# Patient Record
Sex: Female | Born: 1955 | Race: Black or African American | Hispanic: No | Marital: Married | Smoking: Never smoker
Health system: Southern US, Community
[De-identification: ages and names within clinical notes are randomized; demographics above are authoritative.]

---

## 1999-07-18 ENCOUNTER — Other Ambulatory Visit: Admission: RE | Admit: 1999-07-18 | Discharge: 1999-07-18 | Payer: Self-pay | Admitting: Family Medicine

## 2001-01-04 ENCOUNTER — Ambulatory Visit (HOSPITAL_COMMUNITY): Admission: RE | Admit: 2001-01-04 | Discharge: 2001-01-04 | Payer: Self-pay | Admitting: *Deleted

## 2001-07-16 ENCOUNTER — Other Ambulatory Visit: Admission: RE | Admit: 2001-07-16 | Discharge: 2001-07-16 | Payer: Self-pay | Admitting: Family Medicine

## 2003-01-24 ENCOUNTER — Encounter: Payer: Self-pay | Admitting: Emergency Medicine

## 2003-01-24 ENCOUNTER — Inpatient Hospital Stay (HOSPITAL_COMMUNITY): Admission: EM | Admit: 2003-01-24 | Discharge: 2003-01-27 | Payer: Self-pay | Admitting: Emergency Medicine

## 2010-08-19 ENCOUNTER — Observation Stay (HOSPITAL_COMMUNITY): Admission: EM | Admit: 2010-08-19 | Discharge: 2010-08-19 | Payer: Self-pay | Admitting: Emergency Medicine

## 2010-08-21 ENCOUNTER — Emergency Department (HOSPITAL_COMMUNITY): Admission: EM | Admit: 2010-08-21 | Discharge: 2010-08-21 | Payer: Self-pay | Admitting: Emergency Medicine

## 2011-03-13 LAB — COMPREHENSIVE METABOLIC PANEL
Albumin: 3.8 g/dL (ref 3.5–5.2)
BUN: 5 mg/dL — ABNORMAL LOW (ref 6–23)
Creatinine, Ser: 0.87 mg/dL (ref 0.4–1.2)
Total Protein: 7.1 g/dL (ref 6.0–8.3)

## 2011-03-13 LAB — URINE MICROSCOPIC-ADD ON

## 2011-03-13 LAB — CULTURE, BLOOD (ROUTINE X 2)

## 2011-03-13 LAB — URINALYSIS, ROUTINE W REFLEX MICROSCOPIC
Bilirubin Urine: NEGATIVE
Glucose, UA: NEGATIVE mg/dL
Ketones, ur: NEGATIVE mg/dL
Nitrite: POSITIVE — AB
Protein, ur: NEGATIVE mg/dL
Specific Gravity, Urine: 1.003 — ABNORMAL LOW (ref 1.005–1.030)
Urobilinogen, UA: 0.2 mg/dL (ref 0.0–1.0)
pH: 7 (ref 5.0–8.0)

## 2011-03-13 LAB — URINE CULTURE
Colony Count: >=100000
Culture  Setup Time: 201108220514

## 2011-03-13 LAB — DIFFERENTIAL
Basophils Absolute: 0 10*3/uL (ref 0.0–0.1)
Lymphocytes Relative: 14 % (ref 12–46)
Monocytes Absolute: 0.1 10*3/uL (ref 0.1–1.0)
Monocytes Relative: 2 % — ABNORMAL LOW (ref 3–12)
Neutro Abs: 7 10*3/uL (ref 1.7–7.7)

## 2011-03-13 LAB — CBC
MCH: 30.6 pg (ref 26.0–34.0)
MCHC: 33.9 g/dL (ref 30.0–36.0)
MCV: 90.1 fL (ref 78.0–100.0)
Platelets: 231 10*3/uL (ref 150–400)
RDW: 13.1 % (ref 11.5–15.5)

## 2011-03-13 LAB — PROCALCITONIN: Procalcitonin: <0.5 ng/mL

## 2011-05-01 ENCOUNTER — Other Ambulatory Visit: Payer: Self-pay | Admitting: Family Medicine

## 2011-05-01 DIAGNOSIS — M545 Low back pain: Secondary | ICD-10-CM

## 2011-05-05 ENCOUNTER — Ambulatory Visit
Admission: RE | Admit: 2011-05-05 | Discharge: 2011-05-05 | Disposition: A | Discharge status: Home / Self Care | Payer: Commercial Managed Care - PPO | Source: Ambulatory Visit | Attending: Family Medicine | Admitting: Family Medicine

## 2011-05-05 DIAGNOSIS — M545 Low back pain: Secondary | ICD-10-CM

## 2011-05-16 NOTE — Discharge Summary (Signed)
Kelsey Clay, Kelsey Clay                             ACCOUNT NO.:  [id number]   MEDICAL RECORD NO.:  [id number]                   PATIENT TYPE:  INP   LOCATION:  5012                                 FACILITY:  [facility]   PHYSICIAN:  Ollen Gross, M.D.                 DATE OF BIRTH:  1956-03-22   DATE OF ADMISSION:  01/24/2003  DATE OF DISCHARGE:  01/27/2003                                 DISCHARGE SUMMARY   ADMISSION DIAGNOSIS:  Bimalleolar, right ankle fracture with subluxation of  talus.   DISCHARGE DIAGNOSIS:  Right ankle bimalleolar fracture, status post open  reduction internal fixation of right bimalleolar ankle fracture.   PROCEDURE:  The patient was taken to the operating room on January 24, 2003,  and underwent open reduction internal fixation of right ankle, surgeon Dr.  Lequita Halt, assistant Alexzandrew L. Julien Girt, P.A.  Surgery under general  anesthesia.  Minimal blood loss.  Tourniquet time 46 minutes at 350 mmHg.   CONSULTATIONS:  None.   HISTORY OF PRESENT ILLNESS:  The patient is a 55 year old female who fell  last night, the evening before surgery, slipping on the ice.  The patient  had immediate right ankle pain.  The pain had worsened through the morning.  She came into the ER for evaluation.  The patient was seen and found to have  a bimalleolar ankle fracture.  Dr. Lequita Halt was on call.  The patient was  seen and admitted for surgical intervention.   LABORATORY DATA AND X-RAY FINDINGS:  CBC on admission with hemoglobin 13.3,  hematocrit 38.4, white cell count 7.3, red cell count 4.44, differential  within normal limits.  PT/INR on admission was 14.8 and 1.1.  Serial  protimes per protocol.  Last noted PT/INR 16.1 and 1.3.  BMET on admission  within normal limits.   Ankle films on January 24, 2003, with bimalleolar ankle fracture.  EKG dated  January 24, 2003, with normal sinus rhythm, T wave abnormalities.  Consider  inferior ischemia and abnormal EKG.  This is  unconfirmed.   HOSPITAL COURSE:  The patient was admitted to [facility] and taken  to the operating room.  She underwent the above-stated procedure without  complication.  The patient tolerated the procedure well and was later moved  to the recovery room and then to the orthopedic floor for continued postop  care.  The patient was placed on PC analgesic for pain control following  surgery and given p.o. medications.  She was started on Coumadin for DVT  prophylaxis.  PT consult for strict nonweightbearing.  She was given 24  hours of postop IV antibiotics.  The patient started getting physical  therapy and encouraged ice and elevation of the leg when she was not up with  PT.  She did have a little bit of nausea on the evening of postop day #1 and  this was  getting better.  She was initially weaned off the PC analgesics on  postop day #2 over to p.o. medications.  By postop day #3, she was feeling  much better.  She started improving with physical therapy.  Discharge  planning arranged for equipment to be done at home with home health PT and  nursing for control of the Coumadin.  She is doing well and discharged to  home.   DISPOSITION:  The patient was discharged home on January 27, 2003.   DISCHARGE MEDICATIONS:  1. Percocet for pain.  2. Robaxin for spasm.  3. Coumadin for DVT prophylaxis.   DIET:  As tolerated.   ACTIVITY:  Strict nonweightbearing on right lower extremity.  Ambulation  with PT and home health nursing.  Coumadin for DVT prophylaxis.   FOLLOW UP:  Follow up one week from discharge.   CONDITION ON DISCHARGE:  Improved.     Alexzandrew L. Julien Girt, P.A.              Ollen Gross, M.D.    ALP/MEDQ  D:  03/04/2003  T:  03/06/2003  Job:  [id number]

## 2011-05-16 NOTE — Op Note (Signed)
Kelsey Clay, Kelsey Clay                             ACCOUNT NO.:  [id number]   MEDICAL RECORD NO.:  [id number]                   PATIENT TYPE:  INP   LOCATION:  1825                                 FACILITY:  [facility]   PHYSICIAN:  Ollen Gross, M.D.                 DATE OF BIRTH:  [birth year]   DATE OF PROCEDURE:  01/24/2003  DATE OF DISCHARGE:                                 OPERATIVE REPORT   PREOPERATIVE DIAGNOSIS:  Bimalleolar right ankle fracture.   POSTOPERATIVE DIAGNOSIS:  Bimalleolar right ankle fracture.   PROCEDURE:  Open reduction and internal fixation right bimalleolar ankle  fracture.   SURGEON:  Ollen Gross, M.D.   ASSISTANT:  Alexzandrew L. Julien Girt, P.A.   ANESTHESIA:  General.   ESTIMATED BLOOD LOSS:  Minimal.  Drains; none.   COMPLICATIONS:  None.   TOURNIQUET TIME:  46 minutes at 350 mmHg.   CONDITION ON TRANSFER:  Stable to recovery room.   INDICATIONS FOR PROCEDURE:  The patient is a 55 year old female who had a  fall on the ice last night sustaining a displaced bimalleolar ankle fracture  with lateral subluxation of the talus.  She presents now for open reduction  and internal fixation.   DESCRIPTION OF PROCEDURE:  After successful administration of general  anesthetic, the tourniquet was placed high on the right thigh. The right  lower extremity was prepped and draped in the usual sterile fashion.  The  extremities were wrapped with Esmarch, the tourniquet inflated to 350 mmHg.  Incision was made over the lateral malleolus with a 10 blade through the  subcutaneous tissue to the level of the periosteum.  The fracture was  identified, anatomic reduction performed, and then a six-hole 1/3 tubular  locking plate is placed and contoured along the lateral malleolus.  We  filled the proximal three holes with cortical screws and the distal two  holes with locking screws.  This looked excellent anatomic fixation.  The  other hole was left open as it was at the  level of the fracture line. We  took AP, lateral and mortis x-rays showing anatomic reduction. The medial  malleolar fragment had reduced nicely with this. The incision is then made  vertically over the medial malleolus at the tip and the fracture line  identified.  We reduced it, held it with a clamp, and then took two 2.9 mm  drill bits parallel to each other across the tip of the malleolus into the  proximal portion.  We took two 50 mm cancellous screws and placed them  through the drill holes to get an excellent reduction of the medial  malleolus.  There was some slight comminution completely anterior, but the  body of the fracture was anatomically reduced.  The wound was then copiously  irrigated medial and lateral. Multiple other x-rays were taken showing  anatomic reduction. The wound was then copiously  irrigated again  and the subcutaneous tissues were closed with interrupted 2-0 Vicryl and  skin with interrupted 4-0 nylon.  Tourniquet was released with a total time  of 46 minutes.  Bulky sterile dressing and posterior splints were applied.  She was then awakened and transported to the recovery room in stable  condition.                                               Ollen Gross, M.D.    FA/MEDQ  D:  01/24/2003  T:  01/24/2003  Job:  [id number]

## 2011-07-03 ENCOUNTER — Other Ambulatory Visit (HOSPITAL_COMMUNITY)
Admission: RE | Admit: 2011-07-03 | Discharge: 2011-07-03 | Disposition: A | Discharge status: Home / Self Care | Payer: Commercial Managed Care - PPO | Source: Ambulatory Visit | Attending: Family Medicine | Admitting: Family Medicine

## 2011-07-03 ENCOUNTER — Other Ambulatory Visit: Payer: Self-pay | Admitting: Family Medicine

## 2011-07-03 DIAGNOSIS — Z01419 Encounter for gynecological examination (general) (routine) without abnormal findings: Secondary | ICD-10-CM | POA: Insufficient documentation

## 2011-11-03 ENCOUNTER — Other Ambulatory Visit: Payer: Self-pay | Admitting: Gastroenterology

## 2011-12-16 ENCOUNTER — Other Ambulatory Visit: Payer: Self-pay | Admitting: Family Medicine

## 2011-12-16 DIAGNOSIS — D3500 Benign neoplasm of unspecified adrenal gland: Secondary | ICD-10-CM

## 2011-12-18 ENCOUNTER — Ambulatory Visit
Admission: RE | Admit: 2011-12-18 | Discharge: 2011-12-18 | Disposition: A | Discharge status: Home / Self Care | Payer: Commercial Managed Care - PPO | Source: Ambulatory Visit | Attending: Family Medicine | Admitting: Family Medicine

## 2011-12-18 ENCOUNTER — Other Ambulatory Visit: Payer: Self-pay | Admitting: Family Medicine

## 2011-12-18 DIAGNOSIS — D3500 Benign neoplasm of unspecified adrenal gland: Secondary | ICD-10-CM

## 2012-07-05 IMAGING — CT CT ABDOMEN W/O CM
1 of 4 series · 15 of 36 positions shown, 19 images · IV contrast (READICAT/WATER)
Comparison: CT abdomen with contrast 08/19/2010.

CLINICAL DATA: 55-year-old female with 1-year follow-up of left
adrenal lesion.  Occasional abdominal pain.

CT ABDOMEN WITHOUT CONTRAST
TECHNIQUE: Multidetector CT imaging of the abdomen was performed
following the standard protocol without IV contrast.

[Series 2: unenhanced · axial · 0.72mm/px · z∈[-220,+28]mm · 15 of 109 slices shown, 19 images]
[im 5/109  soft-tissue]
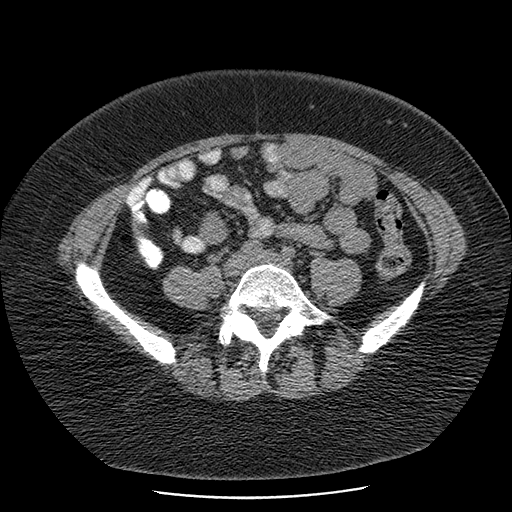
[im 5/109  bone]
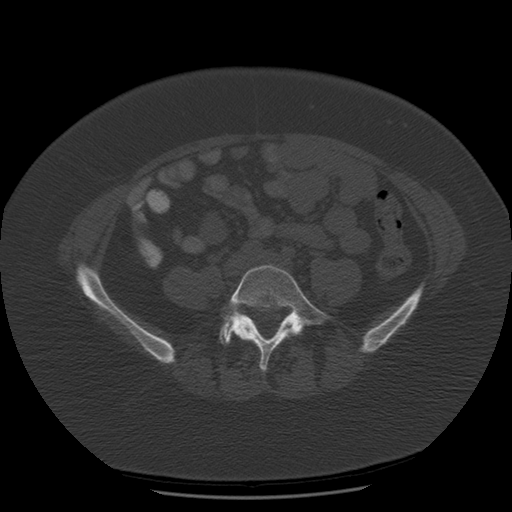
[im 13/109  soft-tissue]
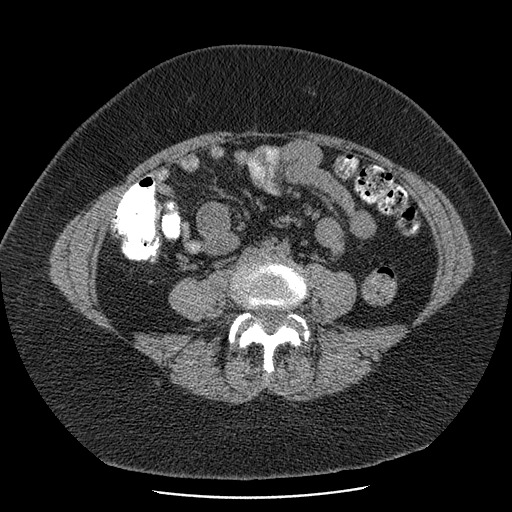
[im 22/109  soft-tissue]
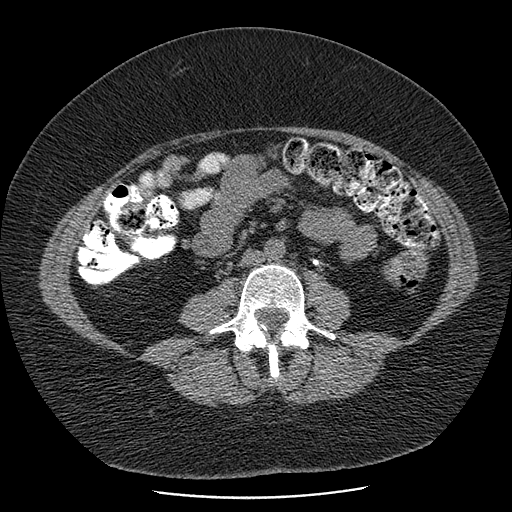
[im 31/109  soft-tissue]
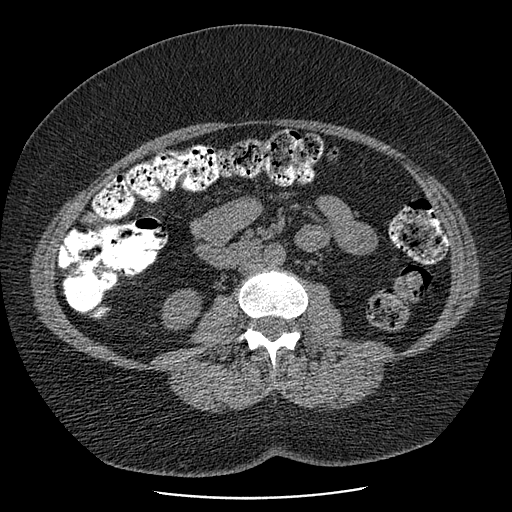
[im 39/109  soft-tissue]
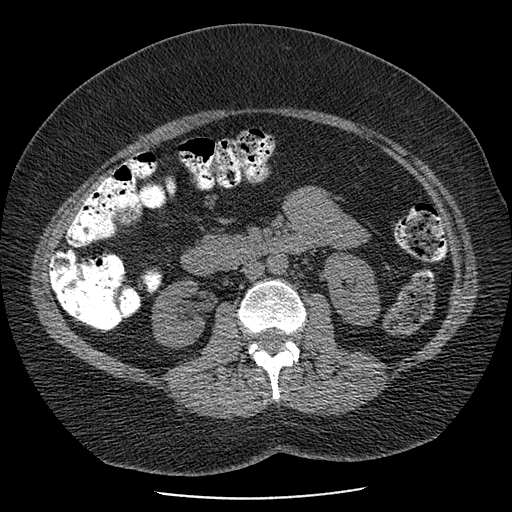
[im 48/109  soft-tissue]
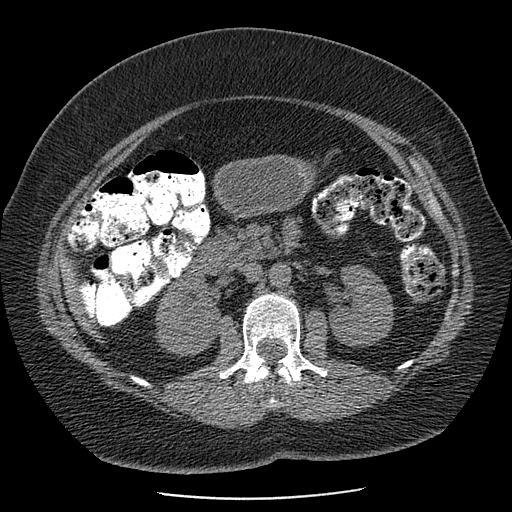
[im 57/109  soft-tissue]
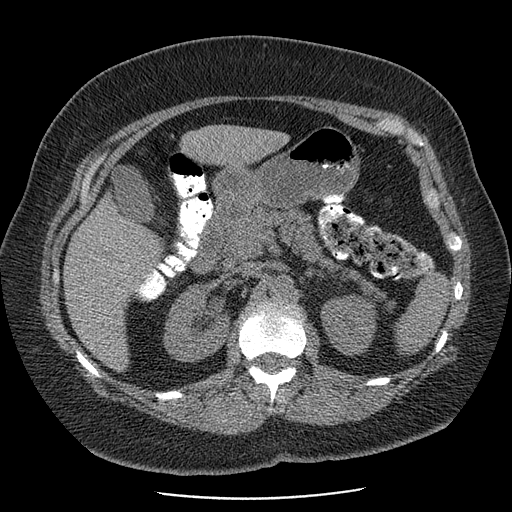
[im 61/109  soft-tissue]
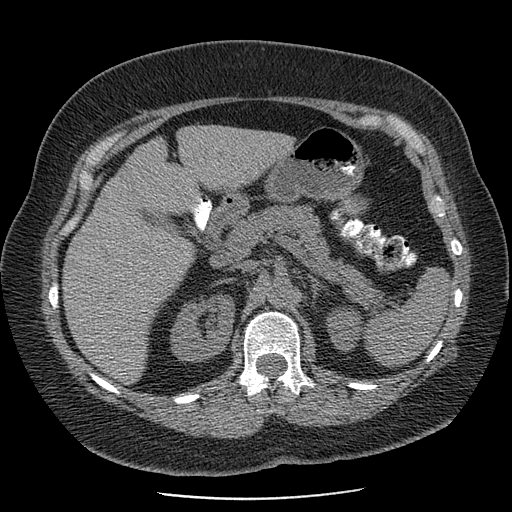
[im 70/109  soft-tissue]
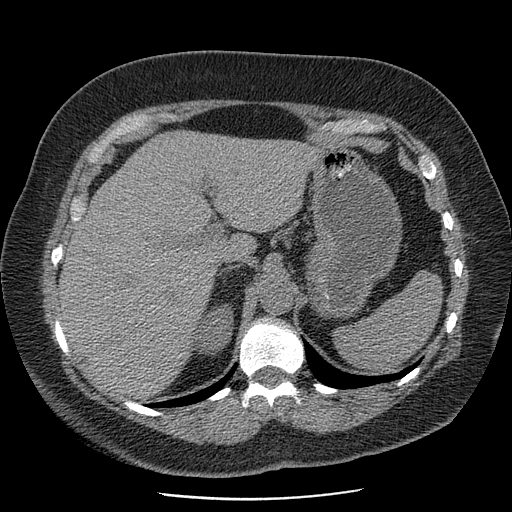
[im 70/109  bone]
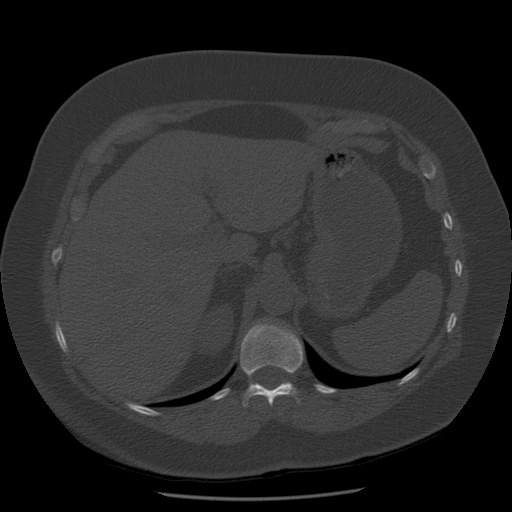
[im 78/109  soft-tissue]
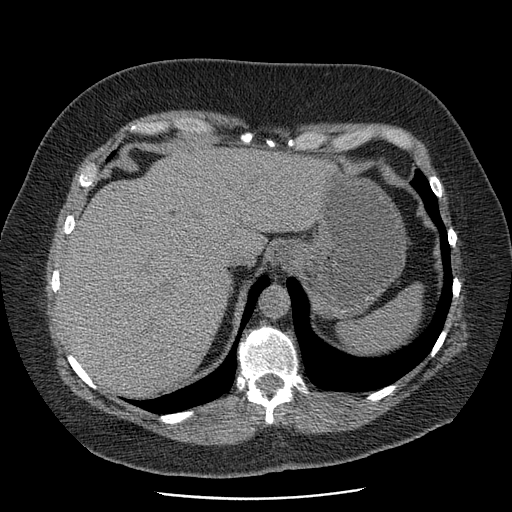
[im 87/109  soft-tissue]
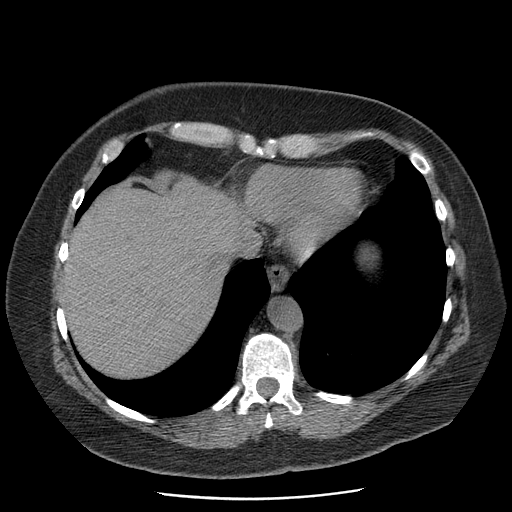
[im 91/109  lung]
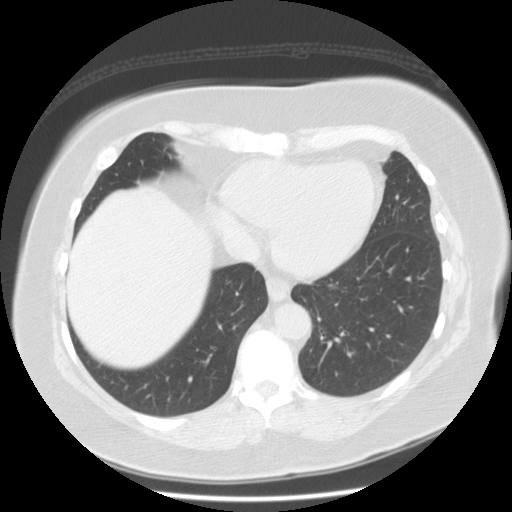
[im 96/109  soft-tissue]
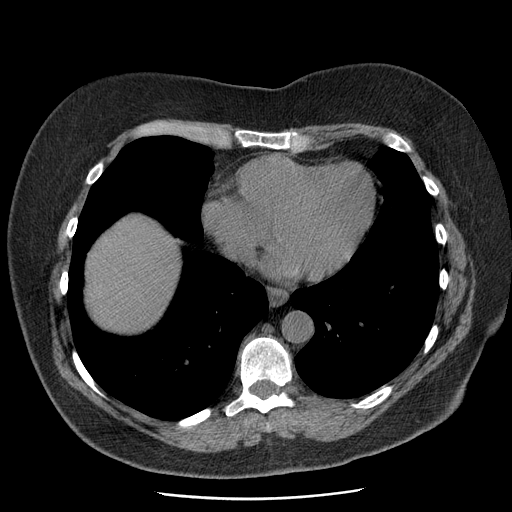
[im 96/109  lung]
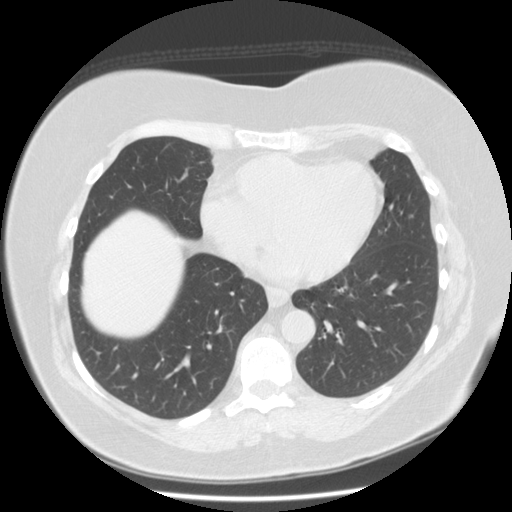
[im 100/109  lung]
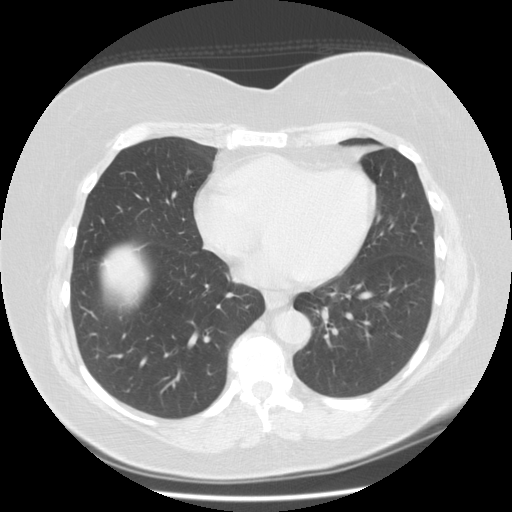
[im 104/109  soft-tissue]
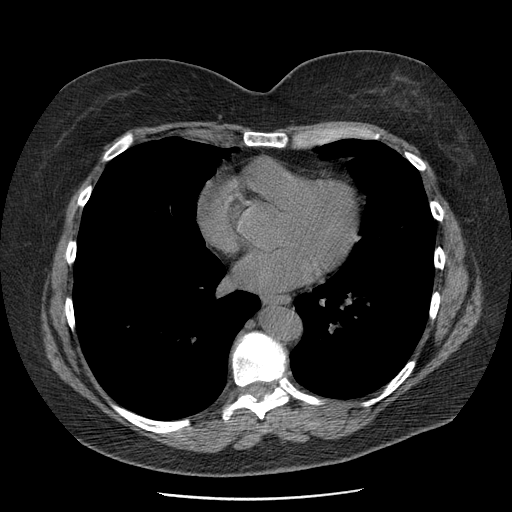
[im 104/109  lung]
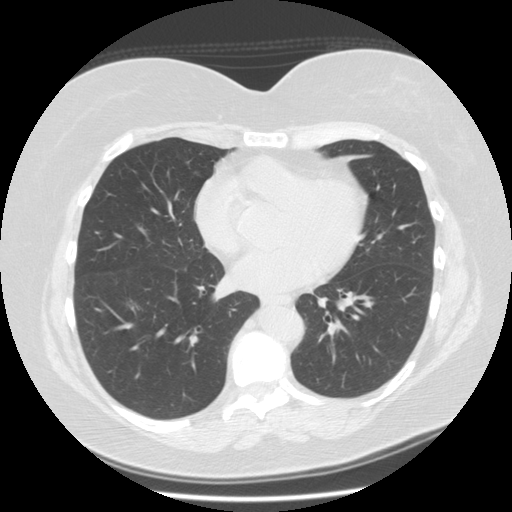

[15 of 36 positions shown; findings below may reference images not displayed]

FINDINGS: Negative lung bases.  Degenerative changes in the spine.
Incidental incomplete fusion of the lower thoracic posterior
elements.

Left adrenal nodule is not significantly changed in size measuring
15 x 15 mm.  Densitometry on the thin slice noncontrast study
averages -12 HU (with Hounsfield units as low as -50 to -60).  No
surrounding lymphadenopathy inflammatory change.

Negative noncontrast stomach, duodenum, and proximal small bowel.
There is oral contrast in the visualized colon mixed with stool.
There is a small volume of oral contrast in the visualized distal
small bowel.  No abdominal free fluid.  Negative noncontrast
spleen, pancreas, and right adrenal gland.  Stable low density area
along the right lateral liver measuring 9 x 13 mm.  There are
occasional sub centimeter low density liver lesions also visible
(central liver 5 mm series 2 image 33).  Otherwise negative
noncontrast liver parenchyma.  Negative noncontrast gallbladder.
Stable kidneys with occasional subtle/small cortical cysts. Partial
or complete duplication of the right renal collecting system re-
identified.
IMPRESSION: 1.  Benign left adrenal nodule is unchanged and is either a lipid
rich adenoma or myelolipoma. No additional follow-up is necessary.
2.  No acute findings in the abdomen.

## 2012-10-29 ENCOUNTER — Other Ambulatory Visit: Payer: Self-pay | Admitting: Neurosurgery

## 2012-10-29 DIAGNOSIS — Q675 Congenital deformity of spine: Secondary | ICD-10-CM

## 2012-10-29 DIAGNOSIS — M47817 Spondylosis without myelopathy or radiculopathy, lumbosacral region: Secondary | ICD-10-CM

## 2012-10-29 DIAGNOSIS — M4310 Spondylolisthesis, site unspecified: Secondary | ICD-10-CM

## 2012-10-29 DIAGNOSIS — M545 Low back pain: Secondary | ICD-10-CM

## 2012-11-09 ENCOUNTER — Ambulatory Visit
Admission: RE | Admit: 2012-11-09 | Discharge: 2012-11-09 | Disposition: A | Discharge status: Home / Self Care | Payer: Commercial Managed Care - PPO | Source: Ambulatory Visit | Attending: Neurosurgery | Admitting: Neurosurgery

## 2012-11-09 DIAGNOSIS — M4310 Spondylolisthesis, site unspecified: Secondary | ICD-10-CM

## 2012-11-09 DIAGNOSIS — Q675 Congenital deformity of spine: Secondary | ICD-10-CM

## 2012-11-09 DIAGNOSIS — M545 Low back pain: Secondary | ICD-10-CM

## 2012-11-09 DIAGNOSIS — M47817 Spondylosis without myelopathy or radiculopathy, lumbosacral region: Secondary | ICD-10-CM

## 2014-01-23 ENCOUNTER — Other Ambulatory Visit (HOSPITAL_COMMUNITY)
Admission: RE | Admit: 2014-01-23 | Discharge: 2014-01-23 | Disposition: A | Discharge status: Home / Self Care | Payer: Commercial Managed Care - PPO | Source: Ambulatory Visit | Attending: Family Medicine | Admitting: Family Medicine

## 2014-01-23 ENCOUNTER — Other Ambulatory Visit: Payer: Self-pay | Admitting: Family Medicine

## 2014-01-23 DIAGNOSIS — Z124 Encounter for screening for malignant neoplasm of cervix: Secondary | ICD-10-CM | POA: Insufficient documentation

## 2016-03-28 LAB — GLUCOSE, POCT (MANUAL RESULT ENTRY): POC Glucose: 93 mg/dl (ref 70–99)

## 2016-06-26 ENCOUNTER — Other Ambulatory Visit: Payer: Self-pay | Admitting: Family Medicine

## 2016-06-26 DIAGNOSIS — Z1231 Encounter for screening mammogram for malignant neoplasm of breast: Secondary | ICD-10-CM

## 2016-07-11 ENCOUNTER — Ambulatory Visit
Admission: RE | Admit: 2016-07-11 | Discharge: 2016-07-11 | Disposition: A | Discharge status: Home / Self Care | Payer: Medicare Other | Source: Ambulatory Visit | Attending: Family Medicine | Admitting: Family Medicine

## 2016-07-11 DIAGNOSIS — Z1231 Encounter for screening mammogram for malignant neoplasm of breast: Secondary | ICD-10-CM

## 2017-02-04 ENCOUNTER — Other Ambulatory Visit: Payer: Self-pay | Admitting: Family Medicine

## 2017-02-04 ENCOUNTER — Other Ambulatory Visit (HOSPITAL_COMMUNITY)
Admission: RE | Admit: 2017-02-04 | Discharge: 2017-02-04 | Disposition: A | Discharge status: Home / Self Care | Payer: Medicare Other | Source: Ambulatory Visit | Attending: Family Medicine | Admitting: Family Medicine

## 2017-02-04 DIAGNOSIS — Z124 Encounter for screening for malignant neoplasm of cervix: Secondary | ICD-10-CM | POA: Insufficient documentation

## 2017-02-06 LAB — CYTOLOGY - PAP: DIAGNOSIS: NEGATIVE

## 2017-04-09 ENCOUNTER — Ambulatory Visit
Admission: RE | Admit: 2017-04-09 | Discharge: 2017-04-09 | Disposition: A | Discharge status: Home / Self Care | Payer: Medicare Other | Source: Ambulatory Visit | Attending: Family Medicine | Admitting: Family Medicine

## 2017-04-09 ENCOUNTER — Other Ambulatory Visit: Payer: Self-pay | Admitting: Family Medicine

## 2017-04-09 DIAGNOSIS — M1990 Unspecified osteoarthritis, unspecified site: Secondary | ICD-10-CM

## 2017-11-10 ENCOUNTER — Other Ambulatory Visit: Payer: Self-pay | Admitting: Family Medicine

## 2017-11-10 DIAGNOSIS — Z1231 Encounter for screening mammogram for malignant neoplasm of breast: Secondary | ICD-10-CM

## 2017-11-17 ENCOUNTER — Ambulatory Visit
Admission: RE | Admit: 2017-11-17 | Discharge: 2017-11-17 | Disposition: A | Discharge status: Home / Self Care | Payer: Medicare Other | Source: Ambulatory Visit | Attending: Family Medicine | Admitting: Family Medicine

## 2017-11-17 DIAGNOSIS — Z1231 Encounter for screening mammogram for malignant neoplasm of breast: Secondary | ICD-10-CM

## 2018-10-28 ENCOUNTER — Other Ambulatory Visit: Payer: Self-pay | Admitting: Podiatry

## 2018-10-28 ENCOUNTER — Encounter: Payer: Self-pay | Admitting: Podiatry

## 2018-10-28 ENCOUNTER — Ambulatory Visit (INDEPENDENT_AMBULATORY_CARE_PROVIDER_SITE_OTHER): Payer: Medicare Other

## 2018-10-28 ENCOUNTER — Ambulatory Visit: Payer: Medicare Other | Admitting: Podiatry

## 2018-10-28 DIAGNOSIS — M778 Other enthesopathies, not elsewhere classified: Secondary | ICD-10-CM

## 2018-10-28 DIAGNOSIS — M2042 Other hammer toe(s) (acquired), left foot: Secondary | ICD-10-CM

## 2018-10-28 DIAGNOSIS — L84 Corns and callosities: Secondary | ICD-10-CM | POA: Diagnosis not present

## 2018-10-28 DIAGNOSIS — M2041 Other hammer toe(s) (acquired), right foot: Secondary | ICD-10-CM | POA: Diagnosis not present

## 2018-10-28 DIAGNOSIS — M21619 Bunion of unspecified foot: Secondary | ICD-10-CM | POA: Diagnosis not present

## 2018-10-28 DIAGNOSIS — M779 Enthesopathy, unspecified: Principal | ICD-10-CM

## 2018-10-28 NOTE — Progress Notes (Signed)
Subjective:   Patient ID: Kelsey Clay, female   DOB: 62 y.o.   MRN: [id number]   HPI Patient presents with significant hammertoe deformity second digit left foot with significant structural bunion deformity bilateral and digital deformities bilateral.  Keratotic lesion on top of the toe that is painful and she cannot cut it and patient does not smoke likes to be active is unable to wear shoe gear comfortably   Review of Systems  All other systems reviewed and are negative.       Objective:  Physical Exam  Constitutional: She appears well-developed and well-nourished.  Cardiovascular: Intact distal pulses.  Pulmonary/Chest: Effort normal.  Musculoskeletal: Normal range of motion.  Neurological: She is alert.  Skin: Skin is warm.  Nursing note and vitals reviewed.   Neurovascular status found to be intact muscle strength is adequate range of motion within normal limits with patient found to have rigid contracture digit to left with dorsal keratotic lesion with severe bunion deformity with a hallux coming underneath the second toe and lifting the second toe into the year.  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  At this time we will get a try sterile debridement of the lesion padding of the area along with wider shoes and consideration for structural correction of the foot if symptoms come back quickly.  Patient will be seen back as needed  X-ray indicates severe structural bunion deformity left over right with elevation of the lesser digits and rigid contracture

## 2018-11-30 ENCOUNTER — Other Ambulatory Visit: Payer: Self-pay | Admitting: Family Medicine

## 2018-11-30 DIAGNOSIS — Z1231 Encounter for screening mammogram for malignant neoplasm of breast: Secondary | ICD-10-CM

## 2019-01-06 ENCOUNTER — Ambulatory Visit
Admission: RE | Admit: 2019-01-06 | Discharge: 2019-01-06 | Disposition: A | Discharge status: Home / Self Care | Payer: Medicare Other | Source: Ambulatory Visit | Attending: Family Medicine | Admitting: Family Medicine

## 2019-01-06 DIAGNOSIS — Z1231 Encounter for screening mammogram for malignant neoplasm of breast: Secondary | ICD-10-CM

## 2019-10-04 ENCOUNTER — Other Ambulatory Visit: Payer: Self-pay

## 2019-10-04 DIAGNOSIS — Z20822 Contact with and (suspected) exposure to covid-19: Secondary | ICD-10-CM

## 2019-10-06 LAB — SPECIMEN STATUS REPORT

## 2019-10-06 LAB — NOVEL CORONAVIRUS, NAA: SARS-CoV-2, NAA: NOT DETECTED

## 2019-10-07 ENCOUNTER — Telehealth: Payer: Self-pay | Admitting: Family Medicine

## 2019-10-07 NOTE — Telephone Encounter (Signed)
Pt called to get COVID results.  Informed pt that they are negative.

## 2019-12-26 ENCOUNTER — Other Ambulatory Visit: Payer: Self-pay | Admitting: Family Medicine

## 2019-12-26 DIAGNOSIS — Z1231 Encounter for screening mammogram for malignant neoplasm of breast: Secondary | ICD-10-CM

## 2020-02-06 ENCOUNTER — Other Ambulatory Visit: Payer: Self-pay

## 2020-02-06 ENCOUNTER — Ambulatory Visit
Admission: RE | Admit: 2020-02-06 | Discharge: 2020-02-06 | Disposition: A | Discharge status: Home / Self Care | Payer: Medicare Other | Source: Ambulatory Visit | Attending: Family Medicine | Admitting: Family Medicine

## 2020-02-06 DIAGNOSIS — Z1231 Encounter for screening mammogram for malignant neoplasm of breast: Secondary | ICD-10-CM

## 2020-02-18 ENCOUNTER — Ambulatory Visit: Payer: Medicare Other | Attending: Internal Medicine

## 2020-02-18 ENCOUNTER — Ambulatory Visit: Payer: Medicare Other

## 2020-02-18 DIAGNOSIS — Z23 Encounter for immunization: Secondary | ICD-10-CM | POA: Insufficient documentation

## 2020-02-18 NOTE — Progress Notes (Signed)
   Covid-19 Vaccination Clinic  Name:  Kelsey Clay    MRN: [id number] DOB: [birth year]  02/18/2020  [name] was observed post Covid-19 immunization for 15 minutes without incidence. She was provided with Vaccine Information Sheet and instruction to access the V-Safe system.   [name] was instructed to call 911 with any severe reactions post vaccine: Marland Kitchen Difficulty breathing  . Swelling of your face and throat  . A fast heartbeat  . A bad rash all over your body  . Dizziness and weakness    Immunizations Administered    Name Date Dose VIS Date Route   Pfizer COVID-19 Vaccine 02/18/2020  3:37 PM 0.3 mL 12/09/2019 Intramuscular   Manufacturer: [place]   Lot: [id number]   [place]: [id number]

## 2020-03-13 ENCOUNTER — Ambulatory Visit: Payer: Medicare Other | Attending: Internal Medicine

## 2020-03-13 DIAGNOSIS — Z23 Encounter for immunization: Secondary | ICD-10-CM

## 2020-03-13 NOTE — Progress Notes (Signed)
   Covid-19 Vaccination Clinic  Name:  Kelsey Clay    MRN: [id number] DOB: 07-11-1956  03/13/2020  [name] was observed post Covid-19 immunization for 15 minutes without incident. She was provided with Vaccine Information Sheet and instruction to access the V-Safe system.   [name] was instructed to call 911 with any severe reactions post vaccine: Marland Kitchen Difficulty breathing  . Swelling of face and throat  . A fast heartbeat  . A bad rash all over body  . Dizziness and weakness   Immunizations Administered    Name Date Dose VIS Date Route   Pfizer COVID-19 Vaccine 03/13/2020  2:20 PM 0.3 mL 12/09/2019 Intramuscular   Manufacturer: [place]   Lot: [id number]   [place]: [id number]

## 2021-01-01 DIAGNOSIS — I129 Hypertensive chronic kidney disease with stage 1 through stage 4 chronic kidney disease, or unspecified chronic kidney disease: Secondary | ICD-10-CM | POA: Diagnosis not present

## 2021-01-01 DIAGNOSIS — M5137 Other intervertebral disc degeneration, lumbosacral region: Secondary | ICD-10-CM | POA: Diagnosis not present

## 2021-01-01 DIAGNOSIS — E7800 Pure hypercholesterolemia, unspecified: Secondary | ICD-10-CM | POA: Diagnosis not present

## 2021-01-01 DIAGNOSIS — Z0000 Encounter for general adult medical examination without abnormal findings: Secondary | ICD-10-CM | POA: Diagnosis not present

## 2021-01-01 DIAGNOSIS — K219 Gastro-esophageal reflux disease without esophagitis: Secondary | ICD-10-CM | POA: Diagnosis not present

## 2021-01-11 ENCOUNTER — Other Ambulatory Visit: Payer: Self-pay | Admitting: Family Medicine

## 2021-01-11 DIAGNOSIS — Z1231 Encounter for screening mammogram for malignant neoplasm of breast: Secondary | ICD-10-CM

## 2021-01-17 DIAGNOSIS — M1711 Unilateral primary osteoarthritis, right knee: Secondary | ICD-10-CM | POA: Diagnosis not present

## 2021-01-17 DIAGNOSIS — M25561 Pain in right knee: Secondary | ICD-10-CM | POA: Diagnosis not present

## 2021-02-18 ENCOUNTER — Ambulatory Visit
Admission: RE | Admit: 2021-02-18 | Discharge: 2021-02-18 | Disposition: A | Discharge status: Home / Self Care | Payer: Medicare Other | Source: Ambulatory Visit | Attending: Family Medicine | Admitting: Family Medicine

## 2021-02-18 ENCOUNTER — Other Ambulatory Visit: Payer: Self-pay

## 2021-02-18 DIAGNOSIS — Z1231 Encounter for screening mammogram for malignant neoplasm of breast: Secondary | ICD-10-CM | POA: Diagnosis not present

## 2021-05-21 DIAGNOSIS — M25561 Pain in right knee: Secondary | ICD-10-CM | POA: Diagnosis not present

## 2021-07-03 DIAGNOSIS — E7800 Pure hypercholesterolemia, unspecified: Secondary | ICD-10-CM | POA: Diagnosis not present

## 2021-07-03 DIAGNOSIS — N183 Chronic kidney disease, stage 3 unspecified: Secondary | ICD-10-CM | POA: Diagnosis not present

## 2021-07-03 DIAGNOSIS — I129 Hypertensive chronic kidney disease with stage 1 through stage 4 chronic kidney disease, or unspecified chronic kidney disease: Secondary | ICD-10-CM | POA: Diagnosis not present

## 2021-10-22 DIAGNOSIS — M1711 Unilateral primary osteoarthritis, right knee: Secondary | ICD-10-CM | POA: Diagnosis not present

## 2021-10-22 DIAGNOSIS — M25561 Pain in right knee: Secondary | ICD-10-CM | POA: Diagnosis not present

## 2022-01-09 ENCOUNTER — Other Ambulatory Visit: Payer: Self-pay | Admitting: Family Medicine

## 2022-01-09 DIAGNOSIS — D3500 Benign neoplasm of unspecified adrenal gland: Secondary | ICD-10-CM | POA: Diagnosis not present

## 2022-01-09 DIAGNOSIS — Z0000 Encounter for general adult medical examination without abnormal findings: Secondary | ICD-10-CM | POA: Diagnosis not present

## 2022-01-09 DIAGNOSIS — N183 Chronic kidney disease, stage 3 unspecified: Secondary | ICD-10-CM | POA: Diagnosis not present

## 2022-01-09 DIAGNOSIS — K219 Gastro-esophageal reflux disease without esophagitis: Secondary | ICD-10-CM | POA: Diagnosis not present

## 2022-01-09 DIAGNOSIS — Z13820 Encounter for screening for osteoporosis: Secondary | ICD-10-CM

## 2022-01-09 DIAGNOSIS — Z23 Encounter for immunization: Secondary | ICD-10-CM | POA: Diagnosis not present

## 2022-01-09 DIAGNOSIS — E7800 Pure hypercholesterolemia, unspecified: Secondary | ICD-10-CM | POA: Diagnosis not present

## 2022-01-09 DIAGNOSIS — M5137 Other intervertebral disc degeneration, lumbosacral region: Secondary | ICD-10-CM | POA: Diagnosis not present

## 2022-01-09 DIAGNOSIS — I129 Hypertensive chronic kidney disease with stage 1 through stage 4 chronic kidney disease, or unspecified chronic kidney disease: Secondary | ICD-10-CM | POA: Diagnosis not present

## 2022-01-09 DIAGNOSIS — Z1389 Encounter for screening for other disorder: Secondary | ICD-10-CM | POA: Diagnosis not present

## 2022-01-21 ENCOUNTER — Other Ambulatory Visit: Payer: Self-pay | Admitting: Family Medicine

## 2022-01-21 DIAGNOSIS — Z1231 Encounter for screening mammogram for malignant neoplasm of breast: Secondary | ICD-10-CM

## 2022-01-30 DIAGNOSIS — M1711 Unilateral primary osteoarthritis, right knee: Secondary | ICD-10-CM | POA: Diagnosis not present

## 2022-02-19 ENCOUNTER — Ambulatory Visit
Admission: RE | Admit: 2022-02-19 | Discharge: 2022-02-19 | Disposition: A | Discharge status: Home / Self Care | Payer: Medicare Other | Source: Ambulatory Visit | Attending: Family Medicine | Admitting: Family Medicine

## 2022-02-19 DIAGNOSIS — Z1231 Encounter for screening mammogram for malignant neoplasm of breast: Secondary | ICD-10-CM | POA: Diagnosis not present

## 2022-03-10 DIAGNOSIS — J0100 Acute maxillary sinusitis, unspecified: Secondary | ICD-10-CM | POA: Diagnosis not present

## 2022-03-10 DIAGNOSIS — R059 Cough, unspecified: Secondary | ICD-10-CM | POA: Diagnosis not present

## 2022-03-10 DIAGNOSIS — J029 Acute pharyngitis, unspecified: Secondary | ICD-10-CM | POA: Diagnosis not present

## 2022-03-10 DIAGNOSIS — J069 Acute upper respiratory infection, unspecified: Secondary | ICD-10-CM | POA: Diagnosis not present

## 2022-03-10 DIAGNOSIS — R0981 Nasal congestion: Secondary | ICD-10-CM | POA: Diagnosis not present

## 2022-06-10 ENCOUNTER — Ambulatory Visit
Admission: RE | Admit: 2022-06-10 | Discharge: 2022-06-10 | Disposition: A | Discharge status: Home / Self Care | Payer: Medicare Other | Source: Ambulatory Visit | Attending: Family Medicine | Admitting: Family Medicine

## 2022-06-10 DIAGNOSIS — Z780 Asymptomatic menopausal state: Secondary | ICD-10-CM | POA: Diagnosis not present

## 2022-06-10 DIAGNOSIS — Z13820 Encounter for screening for osteoporosis: Secondary | ICD-10-CM

## 2022-06-10 DIAGNOSIS — M85851 Other specified disorders of bone density and structure, right thigh: Secondary | ICD-10-CM | POA: Diagnosis not present

## 2022-07-14 DIAGNOSIS — E7800 Pure hypercholesterolemia, unspecified: Secondary | ICD-10-CM | POA: Diagnosis not present

## 2022-07-14 DIAGNOSIS — I129 Hypertensive chronic kidney disease with stage 1 through stage 4 chronic kidney disease, or unspecified chronic kidney disease: Secondary | ICD-10-CM | POA: Diagnosis not present

## 2022-08-12 DIAGNOSIS — Z800 Family history of malignant neoplasm of digestive organs: Secondary | ICD-10-CM | POA: Diagnosis not present

## 2022-08-12 DIAGNOSIS — K648 Other hemorrhoids: Secondary | ICD-10-CM | POA: Diagnosis not present

## 2022-08-12 DIAGNOSIS — Z1211 Encounter for screening for malignant neoplasm of colon: Secondary | ICD-10-CM | POA: Diagnosis not present

## 2022-08-12 DIAGNOSIS — D124 Benign neoplasm of descending colon: Secondary | ICD-10-CM | POA: Diagnosis not present

## 2022-08-12 DIAGNOSIS — K5730 Diverticulosis of large intestine without perforation or abscess without bleeding: Secondary | ICD-10-CM | POA: Diagnosis not present

## 2022-08-15 DIAGNOSIS — D124 Benign neoplasm of descending colon: Secondary | ICD-10-CM | POA: Diagnosis not present

## 2022-09-11 DIAGNOSIS — M1712 Unilateral primary osteoarthritis, left knee: Secondary | ICD-10-CM | POA: Diagnosis not present

## 2022-09-11 DIAGNOSIS — M1711 Unilateral primary osteoarthritis, right knee: Secondary | ICD-10-CM | POA: Diagnosis not present

## 2022-09-11 DIAGNOSIS — M170 Bilateral primary osteoarthritis of knee: Secondary | ICD-10-CM | POA: Diagnosis not present

## 2023-01-13 DIAGNOSIS — N183 Chronic kidney disease, stage 3 unspecified: Secondary | ICD-10-CM | POA: Diagnosis not present

## 2023-01-13 DIAGNOSIS — E7800 Pure hypercholesterolemia, unspecified: Secondary | ICD-10-CM | POA: Diagnosis not present

## 2023-01-13 DIAGNOSIS — K219 Gastro-esophageal reflux disease without esophagitis: Secondary | ICD-10-CM | POA: Diagnosis not present

## 2023-01-13 DIAGNOSIS — D3500 Benign neoplasm of unspecified adrenal gland: Secondary | ICD-10-CM | POA: Diagnosis not present

## 2023-01-13 DIAGNOSIS — Z23 Encounter for immunization: Secondary | ICD-10-CM | POA: Diagnosis not present

## 2023-01-13 DIAGNOSIS — M5137 Other intervertebral disc degeneration, lumbosacral region: Secondary | ICD-10-CM | POA: Diagnosis not present

## 2023-01-13 DIAGNOSIS — Z130 Encounter for screening for diseases of the blood and blood-forming organs and certain disorders involving the immune mechanism: Secondary | ICD-10-CM | POA: Diagnosis not present

## 2023-01-13 DIAGNOSIS — R600 Localized edema: Secondary | ICD-10-CM | POA: Diagnosis not present

## 2023-01-13 DIAGNOSIS — Z0000 Encounter for general adult medical examination without abnormal findings: Secondary | ICD-10-CM | POA: Diagnosis not present

## 2023-01-13 DIAGNOSIS — I129 Hypertensive chronic kidney disease with stage 1 through stage 4 chronic kidney disease, or unspecified chronic kidney disease: Secondary | ICD-10-CM | POA: Diagnosis not present

## 2023-01-13 DIAGNOSIS — D124 Benign neoplasm of descending colon: Secondary | ICD-10-CM | POA: Diagnosis not present

## 2023-01-19 ENCOUNTER — Other Ambulatory Visit: Payer: Self-pay | Admitting: Internal Medicine

## 2023-01-19 DIAGNOSIS — Z1231 Encounter for screening mammogram for malignant neoplasm of breast: Secondary | ICD-10-CM

## 2023-03-10 ENCOUNTER — Ambulatory Visit
Admission: RE | Admit: 2023-03-10 | Discharge: 2023-03-10 | Disposition: A | Discharge status: Home / Self Care | Payer: Medicare Other | Source: Ambulatory Visit | Attending: Internal Medicine | Admitting: Internal Medicine

## 2023-03-10 DIAGNOSIS — Z1231 Encounter for screening mammogram for malignant neoplasm of breast: Secondary | ICD-10-CM | POA: Diagnosis not present

## 2023-03-18 DIAGNOSIS — L819 Disorder of pigmentation, unspecified: Secondary | ICD-10-CM | POA: Diagnosis not present

## 2023-08-03 DIAGNOSIS — K219 Gastro-esophageal reflux disease without esophagitis: Secondary | ICD-10-CM | POA: Diagnosis not present

## 2023-08-03 DIAGNOSIS — M8588 Other specified disorders of bone density and structure, other site: Secondary | ICD-10-CM | POA: Diagnosis not present

## 2023-08-03 DIAGNOSIS — E7800 Pure hypercholesterolemia, unspecified: Secondary | ICD-10-CM | POA: Diagnosis not present

## 2023-08-03 DIAGNOSIS — I129 Hypertensive chronic kidney disease with stage 1 through stage 4 chronic kidney disease, or unspecified chronic kidney disease: Secondary | ICD-10-CM | POA: Diagnosis not present

## 2023-08-03 DIAGNOSIS — N183 Chronic kidney disease, stage 3 unspecified: Secondary | ICD-10-CM | POA: Diagnosis not present

## 2023-08-03 DIAGNOSIS — J302 Other seasonal allergic rhinitis: Secondary | ICD-10-CM | POA: Diagnosis not present

## 2024-01-25 DIAGNOSIS — Z23 Encounter for immunization: Secondary | ICD-10-CM | POA: Diagnosis not present

## 2024-01-25 DIAGNOSIS — Z0000 Encounter for general adult medical examination without abnormal findings: Secondary | ICD-10-CM | POA: Diagnosis not present

## 2024-01-25 DIAGNOSIS — E7800 Pure hypercholesterolemia, unspecified: Secondary | ICD-10-CM | POA: Diagnosis not present

## 2024-01-25 DIAGNOSIS — D124 Benign neoplasm of descending colon: Secondary | ICD-10-CM | POA: Diagnosis not present

## 2024-01-25 DIAGNOSIS — D3500 Benign neoplasm of unspecified adrenal gland: Secondary | ICD-10-CM | POA: Diagnosis not present

## 2024-01-25 DIAGNOSIS — M170 Bilateral primary osteoarthritis of knee: Secondary | ICD-10-CM | POA: Diagnosis not present

## 2024-01-25 DIAGNOSIS — I129 Hypertensive chronic kidney disease with stage 1 through stage 4 chronic kidney disease, or unspecified chronic kidney disease: Secondary | ICD-10-CM | POA: Diagnosis not present

## 2024-01-25 DIAGNOSIS — N183 Chronic kidney disease, stage 3 unspecified: Secondary | ICD-10-CM | POA: Diagnosis not present

## 2024-01-25 DIAGNOSIS — K219 Gastro-esophageal reflux disease without esophagitis: Secondary | ICD-10-CM | POA: Diagnosis not present

## 2024-02-09 ENCOUNTER — Other Ambulatory Visit: Payer: Self-pay | Admitting: Internal Medicine

## 2024-02-09 DIAGNOSIS — Z0000 Encounter for general adult medical examination without abnormal findings: Secondary | ICD-10-CM

## 2024-03-10 ENCOUNTER — Ambulatory Visit
Admission: RE | Admit: 2024-03-10 | Discharge: 2024-03-10 | Disposition: A | Discharge status: Home / Self Care | Payer: Medicare Other | Source: Ambulatory Visit | Attending: Internal Medicine | Admitting: Internal Medicine

## 2024-03-10 DIAGNOSIS — Z0000 Encounter for general adult medical examination without abnormal findings: Secondary | ICD-10-CM

## 2024-03-10 DIAGNOSIS — Z1231 Encounter for screening mammogram for malignant neoplasm of breast: Secondary | ICD-10-CM | POA: Diagnosis not present

## 2024-06-07 DIAGNOSIS — N183 Chronic kidney disease, stage 3 unspecified: Secondary | ICD-10-CM | POA: Diagnosis not present

## 2024-06-07 DIAGNOSIS — I129 Hypertensive chronic kidney disease with stage 1 through stage 4 chronic kidney disease, or unspecified chronic kidney disease: Secondary | ICD-10-CM | POA: Diagnosis not present

## 2024-06-07 DIAGNOSIS — E7800 Pure hypercholesterolemia, unspecified: Secondary | ICD-10-CM | POA: Diagnosis not present

## 2024-06-07 DIAGNOSIS — D3500 Benign neoplasm of unspecified adrenal gland: Secondary | ICD-10-CM | POA: Diagnosis not present
